# Patient Record
Sex: Female | Born: 1975 | Race: Black or African American | Hispanic: No | Marital: Single | Smoking: Never smoker
Health system: Southern US, Community
[De-identification: ages and names within clinical notes are randomized; demographics above are authoritative.]

## PROBLEM LIST (undated history)

## (undated) DIAGNOSIS — B009 Herpesviral infection, unspecified: Secondary | ICD-10-CM

## (undated) DIAGNOSIS — M549 Dorsalgia, unspecified: Secondary | ICD-10-CM

## (undated) DIAGNOSIS — K219 Gastro-esophageal reflux disease without esophagitis: Secondary | ICD-10-CM

## (undated) HISTORY — PX: ABDOMINAL HYSTERECTOMY: SHX81

---

## 2010-12-02 DIAGNOSIS — J309 Allergic rhinitis, unspecified: Secondary | ICD-10-CM | POA: Insufficient documentation

## 2010-12-02 DIAGNOSIS — K219 Gastro-esophageal reflux disease without esophagitis: Secondary | ICD-10-CM | POA: Insufficient documentation

## 2014-08-05 DIAGNOSIS — M722 Plantar fascial fibromatosis: Secondary | ICD-10-CM | POA: Insufficient documentation

## 2015-07-27 ENCOUNTER — Ambulatory Visit
Admission: EM | Admit: 2015-07-27 | Discharge: 2015-07-27 | Disposition: A | Discharge status: Home / Self Care | Payer: Managed Care, Other (non HMO) | Attending: Family Medicine | Admitting: Family Medicine

## 2015-07-27 ENCOUNTER — Encounter: Payer: Self-pay | Admitting: *Deleted

## 2015-07-27 DIAGNOSIS — J019 Acute sinusitis, unspecified: Secondary | ICD-10-CM

## 2015-07-27 DIAGNOSIS — J209 Acute bronchitis, unspecified: Secondary | ICD-10-CM

## 2015-07-27 HISTORY — DX: Gastro-esophageal reflux disease without esophagitis: K21.9

## 2015-07-27 HISTORY — DX: Herpesviral infection, unspecified: B00.9

## 2015-07-27 HISTORY — DX: Dorsalgia, unspecified: M54.9

## 2015-07-27 MED ORDER — AMOXICILLIN-POT CLAVULANATE 875-125 MG PO TABS: 1.0000 | ORAL_TABLET | Freq: Two times a day (BID) | ORAL | Status: DC

## 2015-07-27 MED ORDER — HYDROCOD POLST-CPM POLST ER 10-8 MG/5ML PO SUER: 5.0000 mL | Freq: Two times a day (BID) | ORAL | Status: DC | PRN

## 2015-07-27 MED ORDER — FEXOFENADINE-PSEUDOEPHED ER 180-240 MG PO TB24: 1.0000 | ORAL_TABLET | Freq: Every day | ORAL | Status: DC

## 2015-07-27 NOTE — ED Notes (Signed)
Patient started having symptoms of nasal congestion and cough 2 weeks ago. Patient went to the doctor this past Friday and was prescribed cough medicine. Patient symptoms became severe with nasal congestion, sever cough and sore throat.

## 2015-07-27 NOTE — ED Provider Notes (Signed)
CSN: [id number]     Arrival date & time 07/27/15  4098 History   First MD Initiated Contact with Patient 07/27/15 [phone]    Nurses notes were reviewed. Chief Complaint  Patient presents with  . Cough   Patient reports she's been sick now for the last 12 days. She reports back for as far as his symptoms to improve to a degree but then gets worse again. She reports congestion coughing and is feeling overall poorly. She reports that initially she had put. Remote febrile illness with cough and nasal congestion continued to come back. She was having some female problems middle of last week somewhat upsetting her GYN instead of her PCP and she also states it was easy to [facility]. Her GYN. We'll place on Tessalon Perles 100 mg 3 times a day which seemed to help the cough. She states she asked he felt she was getting better Saturday and Sunday he came back with a vengeance. Sunday night she reports having wheezing coughing and chest congestion. Stasis Sunday she started coughing up thick dark green sputum stent the light yellowish-green that she had been coughing up earlier. States she was unable to sleep last night and feels miserable. She reports sore throat which cancer with his discomfort from coughing and also postnasal drainage and stuffiness sinuses   (Consider location/radiation/quality/duration/timing/severity/associated sxs/prior Treatment) Patient is a 40 y.o. female presenting with cough. The history is provided by the patient. No language interpreter was used.  Cough Cough characteristics:  Productive Sputum characteristics:  Green Severity:  Moderate Duration:  12 days Timing:  Intermittent Progression:  Worsening Chronicity:  New Smoker: no   Context: sick contacts and upper respiratory infection   Relieved by:  Nothing Worsened by:  Activity and lying down Ineffective treatments:  Cough suppressants Associated symptoms: rhinorrhea, shortness of breath, sinus congestion and sore throat    Associated symptoms: no ear fullness and no ear pain     Past Medical History  Diagnosis Date  . GERD (gastroesophageal reflux disease)   . Back pain   . Herpes    History reviewed. No pertinent past surgical history. History reviewed. No pertinent family history. Social History  Substance Use Topics  . Smoking status: Never Smoker   . Smokeless tobacco: Never Used  . Alcohol Use: No   OB History    No data available     Review of Systems  HENT: Positive for rhinorrhea and sore throat. Negative for ear pain.   Respiratory: Positive for cough and shortness of breath.   All other systems reviewed and are negative.   Allergies  Review of patient's allergies indicates no known allergies.  Home Medications   Prior to Admission medications   Medication Sig Start Date End Date Taking? Authorizing Provider  benzonatate (TESSALON) 100 MG capsule Take 100 mg by mouth 3 (three) times daily.   Yes Historical Provider, MD  naproxen (NAPROSYN) 500 MG tablet Take 500 mg by mouth daily.   Yes Historical Provider, MD  norethindrone-ethinyl estradiol (OVCON-35,BALZIVA,BRIELLYN) 0.4-35 MG-MCG tablet Take 1 tablet by mouth daily.   Yes Historical Provider, MD  pantoprazole (PROTONIX) 40 MG tablet Take 40 mg by mouth daily.   Yes Historical Provider, MD  amoxicillin-clavulanate (AUGMENTIN) 875-125 MG tablet Take 1 tablet by mouth 2 (two) times daily. 07/27/15   Hassan Rowan, MD  chlorpheniramine-HYDROcodone [facility] PENNKINETIC ER) 10-8 MG/5ML SUER Take 5 mLs by mouth every 12 (twelve) hours as needed for cough. 07/27/15   Hassan Rowan, MD  fexofenadine-pseudoephedrine (ALLEGRA-D ALLERGY & CONGESTION) 180-240 MG 24 hr tablet Take 1 tablet by mouth daily. 07/27/15   Hassan Rowan, MD   Meds Ordered and Administered this Visit  Medications - No data to display  BP 153/89 mmHg  Pulse 118  Temp(Src) 98.8 F (37.1 C)  Resp 18  Ht  (1.651 m)  Wt 181 lb (82.101 kg)  BMI 30.12 kg/m2  SpO2  100%  LMP 06/23/2015 No data found.   Physical Exam  Constitutional: She is oriented to person, place, and time. She appears well-developed and well-nourished.  HENT:  Head: Normocephalic.  Eyes: Conjunctivae are normal. Pupils are equal, round, and reactive to light.  Neck: Normal range of motion. Neck supple. No thyromegaly present.  Cardiovascular: Normal rate, regular rhythm and normal heart sounds.   Pulmonary/Chest: Effort normal.  Musculoskeletal: Normal range of motion.  Lymphadenopathy:    She has cervical adenopathy.  Neurological: She is alert and oriented to person, place, and time.  Skin: Skin is warm and dry.  Psychiatric: She has a normal mood and affect.  Vitals reviewed.   ED Course  Procedures (including critical care time)  Labs Review Labs Reviewed - No data to display  Imaging Review No results found.   Visual Acuity Review  Right Eye Distance:   Left Eye Distance:   Bilateral Distance:    Right Eye Near:   Left Eye Near:    Bilateral Near:         MDM   1. Acute bronchitis, unspecified organism   2. Acute sinusitis, recurrence not specified, unspecified location    We'll place her on Augmentin 875 one tablet twice a day. Allegra-D 1 tablet daily. Stop Jerilynn Som will place on Tussionex 1 teaspoon twice a day return for follow-up in 2 weeks if needed and give her work note for today and tomorrow.  Note: This dictation was prepared with Dragon dictation along with smaller phrase technology. Any transcriptional errors that result from this process are unintentional.    Hassan Rowan, MD 07/27/15 [phone]

## 2015-07-27 NOTE — Discharge Instructions (Signed)
Sinusitis, Adult Sinusitis is redness, soreness, and puffiness (inflammation) of the air pockets in the bones of your face (sinuses). The redness, soreness, and puffiness can cause air and mucus to get trapped in your sinuses. This can allow germs to grow and cause an infection.  HOME CARE   Drink enough fluids to keep your pee (urine) clear or pale yellow.  Use a humidifier in your home.  Run a hot shower to create steam in the bathroom. Sit in the bathroom with the door closed. Breathe in the steam 3-4 times a day.  Put a warm, moist washcloth on your face 3-4 times a day, or as told by your doctor.  Use salt water sprays (saline sprays) to wet the thick fluid in your nose. This can help the sinuses drain.  Only take medicine as told by your doctor. GET HELP RIGHT AWAY IF:   Your pain gets worse.  You have very bad headaches.  You are sick to your stomach (nauseous).  You throw up (vomit).  You are very sleepy (drowsy) all the time.  Your face is puffy (swollen).  Your vision changes.  You have a stiff neck.  You have trouble breathing. MAKE SURE YOU:   Understand these instructions.  Will watch your condition.  Will get help right away if you are not doing well or get worse.   This information is not intended to replace advice given to you by your health care provider. Make sure you discuss any questions you have with your health care provider.   Document Released: 11/02/2007 Document Revised: 06/06/2014 Document Reviewed: 12/20/2011 Elsevier Interactive Patient Education 2016 Elsevier Inc[employer]  Acute Bronchitis Bronchitis is when the airways that extend from the windpipe into the lungs get red, puffy, and painful (inflamed). Bronchitis often causes thick spit (mucus) to develop. This leads to a cough. A cough is the most common symptom of bronchitis. In acute bronchitis, the condition usually begins suddenly and goes away over time (usually in 2 weeks). Smoking,  allergies, and asthma can make bronchitis worse. Repeated episodes of bronchitis may cause more lung problems. HOME CARE  Rest.  Drink enough fluids to keep your pee (urine) clear or pale yellow (unless you need to limit fluids as told by your doctor).  Only take over-the-counter or prescription medicines as told by your doctor.  Avoid smoking and secondhand smoke. These can make bronchitis worse. If you are a smoker, think about using nicotine gum or skin patches. Quitting smoking will help your lungs heal faster.  Reduce the chance of getting bronchitis again by:  Washing your hands often.  Avoiding people with cold symptoms.  Trying not to touch your hands to your mouth, nose, or eyes.  Follow up with your doctor as told. GET HELP IF: Your symptoms do not improve after 1 week of treatment. Symptoms include:  Cough.  Fever.  Coughing up thick spit.  Body aches.  Chest congestion.  Chills.  Shortness of breath.  Sore throat. GET HELP RIGHT AWAY IF:   You have an increased fever.  You have chills.  You have severe shortness of breath.  You have bloody thick spit (sputum).  You throw up (vomit) often.  You lose too much body fluid (dehydration).  You have a severe headache.  You faint. MAKE SURE YOU:   Understand these instructions.  Will watch your condition.  Will get help right away if you are not doing well or get worse.   This information is  not intended to replace advice given to you by your health care provider. Make sure you discuss any questions you have with your health care provider. °  °Document Released: 11/02/2007 Document Revised: 01/16/2013 Document Reviewed: 11/06/2012 °Elsevier Interactive Patient Education ©2016 Elsevier Inc. ° °

## 2015-11-28 HISTORY — PX: TUBAL LIGATION: SHX77

## 2015-12-25 DIAGNOSIS — N803 Endometriosis of pelvic peritoneum, unspecified: Secondary | ICD-10-CM | POA: Insufficient documentation

## 2016-02-04 ENCOUNTER — Ambulatory Visit
Admission: EM | Admit: 2016-02-04 | Discharge: 2016-02-04 | Disposition: A | Discharge status: Home / Self Care | Payer: Managed Care, Other (non HMO) | Attending: Family Medicine | Admitting: Family Medicine

## 2016-02-04 DIAGNOSIS — J209 Acute bronchitis, unspecified: Secondary | ICD-10-CM | POA: Diagnosis not present

## 2016-02-04 DIAGNOSIS — J019 Acute sinusitis, unspecified: Secondary | ICD-10-CM | POA: Diagnosis not present

## 2016-02-04 MED ORDER — AMOXICILLIN-POT CLAVULANATE 875-125 MG PO TABS: 1.0000 | ORAL_TABLET | Freq: Two times a day (BID) | ORAL | 0 refills | Status: DC

## 2016-02-04 MED ORDER — CETIRIZINE-PSEUDOEPHEDRINE ER 5-120 MG PO TB12: 1.0000 | ORAL_TABLET | Freq: Two times a day (BID) | ORAL | 0 refills | Status: DC | PRN

## 2016-02-04 MED ORDER — TERCONAZOLE 80 MG VA SUPP: 80.0000 mg | Freq: Every day | VAGINAL | 0 refills | Status: DC

## 2016-02-04 MED ORDER — HYDROCOD POLST-CPM POLST ER 10-8 MG/5ML PO SUER: 5.0000 mL | Freq: Two times a day (BID) | ORAL | 0 refills | Status: DC | PRN

## 2016-02-04 NOTE — ED Provider Notes (Signed)
[facility]    CSN: [id number] Arrival date & time: 02/04/16  1191  First Provider Contact:  First MD Initiated Contact with Patient 02/04/16 [phone]        History   Chief Complaint Chief Complaint  Patient presents with  . Cough    HPI Judith Ellis is a 40 y.o. female.   40 year old black female with a history of runningcongestion that started about a week ago last Wednesday. She reports the nasal congestion is gotten worse in the coughing has gotten worse. She has some test next seemed to help cough for a while but she ran out of that. She is taking just plain Zyrtec as well. States that the phlegm that she's gone from nose and coughing up was clear but now states it has become yellow green and thick. Also has been going on now for 9 days. She states she's had a sore throat and coughing and the postnasal drainage. No known drug allergies. Only previous surgery is tubal ligation this diabetes and one of her parents and she does not smoke.      No language interpreter was used.  Cough  Cough characteristics:  Productive Severity:  Moderate Onset quality:  Sudden Timing:  Constant Progression:  Worsening Chronicity:  New Smoker: no   Context: exposure to allergens and upper respiratory infection   Context: not smoke exposure   Relieved by:  Cough suppressants Worsened by:  Nothing Ineffective treatments:  None tried Associated symptoms: rash, rhinorrhea, sinus congestion and sore throat   Associated symptoms: no chest pain, no chills, no diaphoresis, no ear fullness, no ear pain, no fever, no headaches, no myalgias and no wheezing     Past Medical History:  Diagnosis Date  . Back pain   . GERD (gastroesophageal reflux disease)   . Herpes     There are no active problems to display for this patient.   Past Surgical History:  Procedure Laterality Date  . TUBAL LIGATION  11/2015    OB History    No data available       Home Medications    Prior  to Admission medications   Medication Sig Start Date End Date Taking? Authorizing Provider  cetirizine (ZYRTEC) 10 MG tablet Take 10 mg by mouth daily.   Yes Historical Provider, MD  pantoprazole (PROTONIX) 40 MG tablet Take 40 mg by mouth daily.   Yes Historical Provider, MD  amoxicillin-clavulanate (AUGMENTIN) 875-125 MG tablet Take 1 tablet by mouth 2 (two) times daily. 07/27/15   Hassan Rowan, MD  amoxicillin-clavulanate (AUGMENTIN) 875-125 MG tablet Take 1 tablet by mouth 2 (two) times daily. 02/04/16   Hassan Rowan, MD  benzonatate (TESSALON) 100 MG capsule Take 100 mg by mouth 3 (three) times daily.    Historical Provider, MD  cetirizine-pseudoephedrine (ZYRTEC-D) 5-120 MG tablet Take 1 tablet by mouth 2 (two) times daily as needed for allergies. 02/04/16   Hassan Rowan, MD  chlorpheniramine-HYDROcodone [facility] PENNKINETIC ER) 10-8 MG/5ML SUER Take 5 mLs by mouth every 12 (twelve) hours as needed for cough. 07/27/15   Hassan Rowan, MD  chlorpheniramine-HYDROcodone [facility] PENNKINETIC ER) 10-8 MG/5ML SUER Take 5 mLs by mouth every 12 (twelve) hours as needed for cough. 02/04/16   Hassan Rowan, MD  fexofenadine-pseudoephedrine (ALLEGRA-D ALLERGY & CONGESTION) 180-240 MG 24 hr tablet Take 1 tablet by mouth daily. 07/27/15   Hassan Rowan, MD  naproxen (NAPROSYN) 500 MG tablet Take 500 mg by mouth daily.    Historical Provider, MD  norethindrone-ethinyl estradiol (OVCON-35,BALZIVA,BRIELLYN) 0.4-35 MG-MCG tablet Take 1 tablet by mouth daily.    Historical Provider, MD  terconazole (TERAZOL 3) 80 MG vaginal suppository Place 1 suppository (80 mg total) vaginally at bedtime. 02/04/16   Hassan RowanEugene Buffey Zabinski, MD    Family History History reviewed. No pertinent family history.  Social History Social History  Substance Use Topics  . Smoking status: Never Smoker  . Smokeless tobacco: Never Used  . Alcohol use No     Allergies   Review of patient's allergies indicates no known allergies.   Review of  Systems Review of Systems  Constitutional: Negative for chills, diaphoresis and fever.  HENT: Positive for rhinorrhea and sore throat. Negative for ear pain.   Respiratory: Positive for cough. Negative for wheezing.   Cardiovascular: Negative for chest pain.  Musculoskeletal: Negative for myalgias.  Skin: Positive for rash.  Neurological: Negative for headaches.  All other systems reviewed and are negative.    Physical Exam Triage Vital Signs ED Triage Vitals  Enc Vitals Group     BP 02/04/16 0920 105/74     Pulse Rate 02/04/16 0920 80     Resp 02/04/16 0920 16     Temp 02/04/16 0920 98.6 F (37 C)     Temp Source 02/04/16 0920 Tympanic     SpO2 02/04/16 0920 100 %     Weight 02/04/16 0919 182 lb (82.6 kg)     Height 02/04/16 0919 5\' 5"  (1.651 m)     Head Circumference --      Peak Flow --      Pain Score 02/04/16 0922 0     Pain Loc --      Pain Edu? --      Excl. in GC? --    No data found.   Updated Vital Signs BP 105/74 (BP Location: Left Arm)   Pulse 80   Temp 98.6 F (37 C) (Tympanic)   Resp 16   Ht 5\' 5"  (1.651 m)   Wt 182 lb (82.6 kg)   LMP 01/28/2016   SpO2 100%   BMI 30.29 kg/m   Visual Acuity Right Eye Distance:   Left Eye Distance:   Bilateral Distance:    Right Eye Near:   Left Eye Near:    Bilateral Near:     Physical Exam  Constitutional: She is oriented to person, place, and time. She appears well-developed and well-nourished.  HENT:  Head: Normocephalic and atraumatic.  Right Ear: Hearing, tympanic membrane, external ear and ear canal normal.  Left Ear: Hearing, tympanic membrane and external ear normal.  Nose: Mucosal edema and rhinorrhea present. Right sinus exhibits maxillary sinus tenderness. Left sinus exhibits maxillary sinus tenderness.  Mouth/Throat: Uvula is midline, oropharynx is clear and moist and mucous membranes are normal. No oropharyngeal exudate or posterior oropharyngeal edema.  Eyes: Pupils are equal, round, and  reactive to light.  Neck: Normal range of motion. Neck supple.  Cardiovascular: Normal rate, regular rhythm and normal heart sounds.   Pulmonary/Chest: Effort normal and breath sounds normal.  Musculoskeletal: Normal range of motion.  Lymphadenopathy:    She has cervical adenopathy.  Neurological: She is alert and oriented to person, place, and time.  Skin: Skin is warm.  Psychiatric: She has a normal mood and affect.  Vitals reviewed.    UC Treatments / Results  Labs (all labs ordered are listed, but only abnormal results are displayed) Labs Reviewed - No data to display  EKG  EKG Interpretation None  Radiology No results found.  Procedures Procedures (including critical care time)  Medications Ordered in UC Medications - No data to display   Initial Impression / Assessment and Plan / UC Course  I have reviewed the triage vital signs and the nursing notes.  Pertinent labs & imaging results that were available during my care of the patient were reviewed by me and considered in my medical decision making (see chart for details).  Clinical Course    We'll place patient on Tussionex 1 teaspoon twice a day she's used this before and he seemed to help. Patient Zyrtec-D 1 tablet twice a day and Augmentin twice daily as well. She does get yeast infection but states Diflucan doesn't help so she will be given a prescription for Terazol vaginal suppositories to use nightly for 3 nights. Work note given for today and tomorrow as needed.  Final Clinical Impressions(s) / UC Diagnoses   Final diagnoses:  Acute bronchitis, unspecified organism  Acute sinusitis, unspecified    New Prescriptions Discharge Medication List as of 02/04/2016  9:49 AM    START taking these medications   Details  !! amoxicillin-clavulanate (AUGMENTIN) 875-125 MG tablet Take 1 tablet by mouth 2 (two) times daily., Starting Thu 02/04/2016, Normal    cetirizine-pseudoephedrine (ZYRTEC-D) 5-120 MG  tablet Take 1 tablet by mouth 2 (two) times daily as needed for allergies., Starting Thu 02/04/2016, Normal    !! chlorpheniramine-HYDROcodone (TUSSIONEX PENNKINETIC ER) 10-8 MG/5ML SUER Take 5 mLs by mouth every 12 (twelve) hours as needed for cough., Starting Thu 02/04/2016, Normal    terconazole (TERAZOL 3) 80 MG vaginal suppository Place 1 suppository (80 mg total) vaginally at bedtime., Starting Thu 02/04/2016, Normal     !! - Potential duplicate medications found. Please discuss with provider.       Hassan Rowan, MD 02/04/16 1007

## 2016-02-04 NOTE — ED Triage Notes (Signed)
Patient complains of cough, sneezing, sinus pain and pressure that started one week ago. Patient states that she has been using zyrtec with little relief.

## 2016-07-08 DIAGNOSIS — D573 Sickle-cell trait: Secondary | ICD-10-CM | POA: Insufficient documentation

## 2016-07-15 DIAGNOSIS — E559 Vitamin D deficiency, unspecified: Secondary | ICD-10-CM | POA: Insufficient documentation

## 2017-02-05 ENCOUNTER — Ambulatory Visit
Admission: EM | Admit: 2017-02-05 | Discharge: 2017-02-05 | Disposition: A | Discharge status: Home / Self Care | Payer: Managed Care, Other (non HMO) | Attending: Family Medicine | Admitting: Family Medicine

## 2017-02-05 ENCOUNTER — Encounter: Payer: Self-pay | Admitting: Gynecology

## 2017-02-05 DIAGNOSIS — J208 Acute bronchitis due to other specified organisms: Secondary | ICD-10-CM | POA: Diagnosis not present

## 2017-02-05 DIAGNOSIS — J0120 Acute ethmoidal sinusitis, unspecified: Secondary | ICD-10-CM

## 2017-02-05 MED ORDER — FLUCONAZOLE 150 MG PO TABS: ORAL_TABLET | ORAL | 0 refills | Status: AC

## 2017-02-05 MED ORDER — AMOXICILLIN-POT CLAVULANATE 875-125 MG PO TABS: 1.0000 | ORAL_TABLET | Freq: Two times a day (BID) | ORAL | 0 refills | Status: AC

## 2017-02-05 NOTE — ED Triage Notes (Signed)
Patient c/o deep cough over 1 week.

## 2017-02-05 NOTE — ED Provider Notes (Signed)
[facility]    CSN: [id number] Arrival date & time: 02/05/17  0908     History   Chief Complaint Chief Complaint  Patient presents with  . Cough    HPI Judith Ellis is a 41 y.o. female.   41 year old female presents with nasal congestion and cough for over 3 weeks. Nasal congestion has improved but continues to have cough with yellow to green mucus and headaches. Denies any fever or GI symptoms. Has taken Tessalon cough pills with no relief and Mucinex with some relief. Co-worker is recently ill with similar symptoms. No other chronic health issues currently except GERD and takes Protonix daily.    The history is provided by the patient.    Past Medical History:  Diagnosis Date  . Back pain   . GERD (gastroesophageal reflux disease)   . Herpes     There are no active problems to display for this patient.   Past Surgical History:  Procedure Laterality Date  . ABDOMINAL HYSTERECTOMY    . TUBAL LIGATION  11/2015    OB History    No data available       Home Medications    Prior to Admission medications   Medication Sig Start Date End Date Taking? Authorizing Provider  pantoprazole (PROTONIX) 40 MG tablet Take 40 mg by mouth daily.   Yes [provider]  amoxicillin-clavulanate (AUGMENTIN) 875-125 MG tablet Take 1 tablet by mouth every 12 (twelve) hours. 02/05/17 02/12/17  Sudie Grumbling, NP  fluconazole (DIFLUCAN) 150 MG tablet Take 1 tablet by mouth tomorrow, repeat 1 tablet again in 5 days to prevent antibiotic induced yeast vaginitis 02/05/17   Sudie Grumbling, NP    Family History No family history on file.  Social History Social History  Substance Use Topics  . Smoking status: Never Smoker  . Smokeless tobacco: Never Used  . Alcohol use No     Allergies   Patient has no known allergies.   Review of Systems Review of Systems  Constitutional: Positive for fatigue. Negative for activity change, appetite change, chills and  fever.  HENT: Positive for congestion, postnasal drip, sinus pressure and sore throat. Negative for ear discharge, ear pain, mouth sores, nosebleeds, rhinorrhea, sinus pain, sneezing and trouble swallowing.   Eyes: Negative for discharge, redness and itching.  Respiratory: Positive for cough and chest tightness. Negative for shortness of breath and wheezing.   Cardiovascular: Negative for chest pain, palpitations and leg swelling.  Gastrointestinal: Negative for abdominal pain, diarrhea, nausea and vomiting.  Genitourinary: Negative for difficulty urinating and dysuria.  Musculoskeletal: Negative for arthralgias, back pain, myalgias, neck pain and neck stiffness.  Skin: Negative for rash and wound.  Allergic/Immunologic: Negative for immunocompromised state.  Neurological: Positive for headaches. Negative for dizziness, seizures, syncope, weakness, light-headedness and numbness.  Hematological: Negative for adenopathy. Does not bruise/bleed easily.     Physical Exam Triage Vital Signs ED Triage Vitals  Enc Vitals Group     BP 02/05/17 0920 107/73     Pulse Rate 02/05/17 0920 70     Resp --      Temp 02/05/17 0920 98.4 F (36.9 C)     Temp Source 02/05/17 0920 Oral     SpO2 02/05/17 0920 100 %     Weight 02/05/17 0921 182 lb (82.6 kg)     Height 02/05/17 0921  (1.651 m)     Head Circumference --      Peak Flow --  Pain Score 02/05/17 0921 3     Pain Loc --      Pain Edu? --      Excl. in GC? --    No data found.   Updated Vital Signs BP 107/73 (BP Location: Left Arm)   Pulse 70   Temp 98.4 F (36.9 C) (Oral)   Ht  (1.651 m)   Wt 182 lb (82.6 kg)   LMP 01/28/2016   SpO2 100%   BMI 30.29 kg/m   Visual Acuity Right Eye Distance:   Left Eye Distance:   Bilateral Distance:    Right Eye Near:   Left Eye Near:    Bilateral Near:     Physical Exam  Constitutional: She is oriented to person, place, and time. She appears well-developed and  well-nourished. She appears ill. No distress.  HENT:  Head: Normocephalic and atraumatic.  Right Ear: Hearing, tympanic membrane, external ear and ear canal normal.  Left Ear: Hearing, tympanic membrane, external ear and ear canal normal.  Nose: Mucosal edema and rhinorrhea present. Right sinus exhibits no maxillary sinus tenderness and no frontal sinus tenderness. Left sinus exhibits no maxillary sinus tenderness and no frontal sinus tenderness.  Mouth/Throat: Uvula is midline and mucous membranes are normal. Posterior oropharyngeal erythema (with yellow post nasal drainage) present.  Eyes: Conjunctivae and EOM are normal.  Neck: Normal range of motion. Neck supple.  Cardiovascular: Normal rate, regular rhythm and normal heart sounds.   No murmur heard. Pulmonary/Chest: Effort normal. No respiratory distress. She has decreased breath sounds in the right middle field, the right lower field and the left lower field. She has no wheezes. She has no rhonchi. She has no rales.  Musculoskeletal: Normal range of motion.  Lymphadenopathy:       Head (right side): Tonsillar adenopathy present.       Head (left side): Tonsillar adenopathy present.    She has cervical adenopathy.       Right cervical: Superficial cervical adenopathy present.       Left cervical: Superficial cervical adenopathy present.  Neurological: She is alert and oriented to person, place, and time.  Skin: Skin is warm and dry. Capillary refill takes less than 2 seconds.  Psychiatric: She has a normal mood and affect. Her behavior is normal. Judgment and thought content normal.     UC Treatments / Results  Labs (all labs ordered are listed, but only abnormal results are displayed) Labs Reviewed - No data to display  EKG  EKG Interpretation None       Radiology No results found.  Procedures Procedures (including critical care time)  Medications Ordered in UC Medications - No data to display   Initial Impression  / Assessment and Plan / UC Course  I have reviewed the triage vital signs and the nursing notes.  Pertinent labs & imaging results that were available during my care of the patient were reviewed by me and considered in my medical decision making (see chart for details).    Discussed with patient that she has mild sinusitis and bronchitis. Recommend start  Augmentin  twice a day as directed (not listed properly in Epic in discharge medication list but under new Rx). Continue taking Mucinex as directed. Take Diflucan  one tablet tomorrow and repeat 1 dose in 5 days to prevent antibiotic-induced yeast vaginitis. Increase fluid intake to help loosen up mucus. Follow-up in 5 days with her PCP if not improving.    Final Clinical Impressions(s) / UC  Diagnoses   Final diagnoses:  Acute bronchitis due to other specified organisms  Acute ethmoidal sinusitis, recurrence not specified    New Prescriptions Discharge Medication List as of 02/05/2017  9:51 AM    START taking these medications   Details  fluconazole (DIFLUCAN) 150 MG tablet Take 1 tablet by mouth tomorrow, repeat 1 tablet again in 5 days to prevent antibiotic induced yeast vaginitis, Normal         Controlled Substance Prescriptions [place] Controlled Substance Registry consulted? Not Applicable   Sudie Grumblingmyot, Ann Berry, NP 02/05/17 1203

## 2017-02-05 NOTE — Discharge Instructions (Addendum)
Recommend start Augmentin  twice a day as directed. Continue taking Mucinex as directed. Increase fluid intake to help loosen up mucus. Follow-up in 5 days with your PCP if not improving.

## 2017-06-28 DIAGNOSIS — D0511 Intraductal carcinoma in situ of right breast: Secondary | ICD-10-CM | POA: Insufficient documentation

## 2017-07-12 DIAGNOSIS — E669 Obesity, unspecified: Secondary | ICD-10-CM | POA: Insufficient documentation

## 2017-10-11 DIAGNOSIS — Z9889 Other specified postprocedural states: Secondary | ICD-10-CM | POA: Insufficient documentation

## 2020-04-29 DIAGNOSIS — Z90710 Acquired absence of both cervix and uterus: Secondary | ICD-10-CM | POA: Insufficient documentation

## 2021-02-08 ENCOUNTER — Ambulatory Visit
Admission: EM | Admit: 2021-02-08 | Discharge: 2021-02-08 | Disposition: A | Discharge status: Home / Self Care | Payer: Managed Care, Other (non HMO) | Attending: Physician Assistant | Admitting: Physician Assistant

## 2021-02-08 ENCOUNTER — Other Ambulatory Visit: Payer: Self-pay

## 2021-02-08 ENCOUNTER — Encounter: Payer: Self-pay | Admitting: Emergency Medicine

## 2021-02-08 ENCOUNTER — Ambulatory Visit (INDEPENDENT_AMBULATORY_CARE_PROVIDER_SITE_OTHER): Payer: Managed Care, Other (non HMO)

## 2021-02-08 DIAGNOSIS — S92515A Nondisplaced fracture of proximal phalanx of left lesser toe(s), initial encounter for closed fracture: Secondary | ICD-10-CM | POA: Diagnosis not present

## 2021-02-08 DIAGNOSIS — M79675 Pain in left toe(s): Secondary | ICD-10-CM | POA: Diagnosis not present

## 2021-02-08 NOTE — ED Triage Notes (Signed)
Pt presents today with c/o pain/swelling to left 5th (pinky) toe x 1 week. She reports striking it on bedroom wall.

## 2021-02-08 NOTE — ED Provider Notes (Signed)
[facility]    CSN: [id number] Arrival date & time: 02/08/21  0800      History   Chief Complaint Chief Complaint  Patient presents with   Toe Pain   Toe Injury    Left 5th toe    HPI Judith Ellis is a 45 y.o. female presenting for 1 week history or pain, swelling, bruising to the left fifth toe. She says that she struck it on her bathroom wall a week ago and then yesterday she says she excellently ran into her husband's foot and it began to hurt again.  No numbness, tingling, weakness. No history of prior fracture.  Patient has not applied ice to the toe.  She did take ibuprofen yesterday which helped.  Patient is a Biochemist, clinical.  She has no other complaints.  HPI  Past Medical History:  Diagnosis Date   Back pain    GERD (gastroesophageal reflux disease)    Herpes     There are no problems to display for this patient.   Past Surgical History:  Procedure Laterality Date   ABDOMINAL HYSTERECTOMY     TUBAL LIGATION  11/2015    OB History   No obstetric history on file.      Home Medications    Prior to Admission medications   Medication Sig Start Date End Date Taking? Authorizing Provider  fluconazole (DIFLUCAN) 150 MG tablet Take 1 tablet by mouth tomorrow, repeat 1 tablet again in 5 days to prevent antibiotic induced yeast vaginitis 02/05/17   Sudie Grumbling, NP  pantoprazole (PROTONIX) 40 MG tablet Take 40 mg by mouth daily.    [provider]    Family History History reviewed. No pertinent family history.  Social History Social History   Tobacco Use   Smoking status: Never   Smokeless tobacco: Never  Substance Use Topics   Alcohol use: No   Drug use: No     Allergies   Patient has no known allergies.   Review of Systems Review of Systems  Musculoskeletal:  Positive for arthralgias and joint swelling. Negative for gait problem.  Skin:  Positive for color change. Negative for wound.  Neurological:  Negative for  weakness and numbness.    Physical Exam Triage Vital Signs ED Triage Vitals  Enc Vitals Group     BP 02/08/21 0826 119/88     Pulse Rate 02/08/21 0826 76     Resp 02/08/21 0826 18     Temp 02/08/21 0826 98.2 F (36.8 C)     Temp Source 02/08/21 0826 Oral     SpO2 02/08/21 0826 100 %     Weight --      Height --      Head Circumference --      Peak Flow --      Pain Score 02/08/21 0823 4     Pain Loc --      Pain Edu? --      Excl. in GC? --    No data found.  Updated Vital Signs BP 119/88 (BP Location: Right Arm)   Pulse 76   Temp 98.2 F (36.8 C) (Oral)   Resp 18   LMP 01/28/2016   SpO2 100%       Physical Exam Vitals and nursing note reviewed.  Constitutional:      General: She is not in acute distress.    Appearance: Normal appearance. She is not ill-appearing or toxic-appearing.  HENT:     Head: Normocephalic  and atraumatic.  Eyes:     General: No scleral icterus.       Right eye: No discharge.        Left eye: No discharge.     Conjunctiva/sclera: Conjunctivae normal.  Cardiovascular:     Rate and Rhythm: Normal rate and regular rhythm.     Pulses: Normal pulses.  Pulmonary:     Effort: Pulmonary effort is normal. No respiratory distress.  Musculoskeletal:     Cervical back: Neck supple.     Left foot: Normal range of motion and normal capillary refill. Swelling (mild swelling left 5th toe) and bony tenderness (TTP proximal phalanx) present. Normal pulse.  Skin:    General: Skin is dry.  Neurological:     General: No focal deficit present.     Mental Status: She is alert. Mental status is at baseline.     Motor: No weakness.     Gait: Gait normal.  Psychiatric:        Mood and Affect: Mood normal.        Behavior: Behavior normal.        Thought Content: Thought content normal.     UC Treatments / Results  Labs (all labs ordered are listed, but only abnormal results are displayed) Labs Reviewed - No data to  display  EKG   Radiology DG Toe 5th Left  Result Date: 02/08/2021 CLINICAL DATA:  Pain and swelling to fifth toe. EXAM: DG TOE 5TH LEFT COMPARISON:  None. FINDINGS: Oblique comminuted fracture noted in the diaphysis of the proximal phalanx without extension to an articular surface. Mild apex plantar angulation. IMPRESSION: Comminuted oblique fracture in the diaphysis of the proximal phalanx. Electronically Signed   By: Kennith Center M.D.   On: 02/08/2021 08:58    Procedures Procedures (including critical care time)  Medications Ordered in UC Medications - No data to display  Initial Impression / Assessment and Plan / UC Course  I have reviewed the triage vital signs and the nursing notes.  Pertinent labs & imaging results that were available during my care of the patient were reviewed by me and considered in my medical decision making (see chart for details).  44 old female presenting for left fifth toe pain for the past week.  X-ray obtained today shows comminuted oblique fracture of the proximal phalanx.  Reviewed result patient.  Advised her to was fractured.  Patient had toe buddy taped to fourth digit and placed in postop shoe.  Reviewed RICE guidelines and supportive care.  Contact information provided for orthopedics.  Work note given for seated work for the next 10 days.  Follow-up with [place] as needed.  Final Clinical Impressions(s) / UC Diagnoses   Final diagnoses:  Closed nondisplaced fracture of proximal phalanx of lesser toe of left foot, initial encounter     Discharge Instructions      Your toe is fractured.  It is fractured all the way through.  I would advise staying off of the toe for the next couple of weeks, at least 10 days and then you should be able to walk on it again.  Ice the toe every couple of hours and take ibuprofen and Tylenol as needed for pain relief.  Make an appointment with your primary care provider in the next couple weeks for follow-up.  Otherwise,  you can follow-up with [facility].  In such cases, as we discussed orthopedic surgeons may place a pin in the bone.  I have included contact information  for orthopedics below.  You have a condition requiring you to follow up with Orthopedics so please call one of the following office for appointment:   [facility] [address] [place], [place] [address] Phone: [phone]  [facility] [address] [place], [place] [address] Phone: [phone]      ED Prescriptions   None    PDMP not reviewed this encounter.   Shirlee Latch, PA-C 02/08/21 [phone]

## 2021-02-08 NOTE — Discharge Instructions (Signed)
Your toe is fractured.  It is fractured all the way through.  I would advise staying off of the toe for the next couple of weeks, at least 10 days and then you should be able to walk on it again.  Ice the toe every couple of hours and take ibuprofen and Tylenol as needed for pain relief.  Make an appointment with your primary care provider in the next couple weeks for follow-up.  Otherwise, you can follow-up with [facility].  In such cases, as we discussed orthopedic surgeons may place a pin in the bone.  I have included contact information for orthopedics below.  You have a condition requiring you to follow up with Orthopedics so please call one of the following office for appointment:   [facility] [address] [place], [place] [address] Phone: [phone]  [facility] [address] [place], [place] [address] Phone: [phone]

## 2022-09-30 IMAGING — CR DG TOE 5TH 2+V*L*
3 series · 3 of 3 positions shown · non-contrast
Comparison: None.

CLINICAL DATA: Pain and swelling to fifth toe.

EXAM:
DG TOE 5TH LEFT

[toe ap]
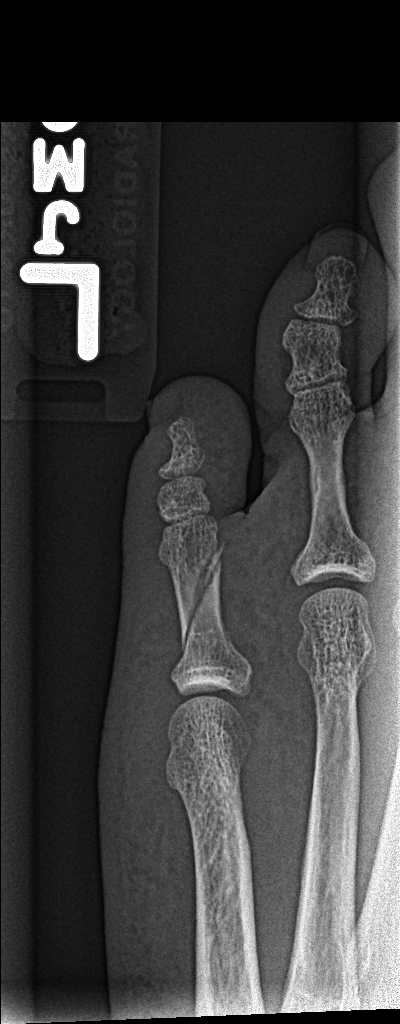

[toe obl]
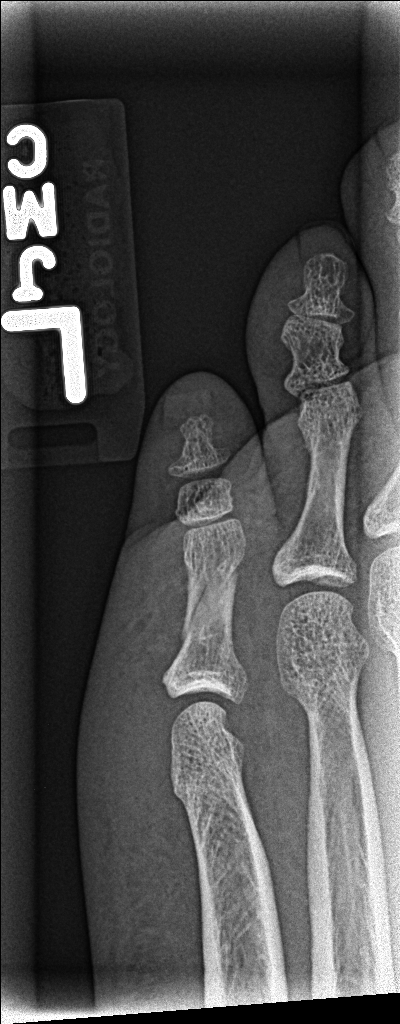

[toe lat]
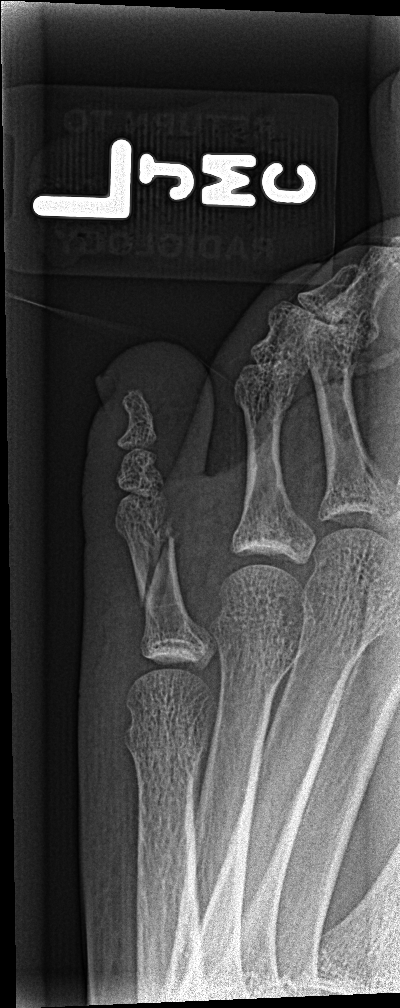

[3 of 3 positions shown; findings below may reference images not displayed]

FINDINGS: Oblique comminuted fracture noted in the diaphysis of the proximal
phalanx without extension to an articular surface. Mild apex plantar
angulation.
IMPRESSION: Comminuted oblique fracture in the diaphysis of the proximal
phalanx.

## 2022-11-03 DIAGNOSIS — M549 Dorsalgia, unspecified: Secondary | ICD-10-CM | POA: Insufficient documentation

## 2022-12-18 ENCOUNTER — Ambulatory Visit
Admission: EM | Admit: 2022-12-18 | Discharge: 2022-12-18 | Disposition: A | Discharge status: Home / Self Care | Payer: Managed Care, Other (non HMO) | Attending: Physician Assistant | Admitting: Physician Assistant

## 2022-12-18 ENCOUNTER — Ambulatory Visit (INDEPENDENT_AMBULATORY_CARE_PROVIDER_SITE_OTHER): Payer: Managed Care, Other (non HMO)

## 2022-12-18 DIAGNOSIS — R0782 Intercostal pain: Secondary | ICD-10-CM | POA: Diagnosis not present

## 2022-12-18 DIAGNOSIS — R0781 Pleurodynia: Secondary | ICD-10-CM | POA: Diagnosis not present

## 2022-12-18 DIAGNOSIS — S2242XA Multiple fractures of ribs, left side, initial encounter for closed fracture: Secondary | ICD-10-CM

## 2022-12-18 MED ORDER — HYDROCODONE-ACETAMINOPHEN 5-325 MG PO TABS: 1.0000 | ORAL_TABLET | Freq: Four times a day (QID) | ORAL | 0 refills | Status: AC | PRN

## 2022-12-18 NOTE — ED Triage Notes (Signed)
Patient presents to UC for rib injury x 3 weeks. She fell and landed face down. States left worse, right is better. Treating pain with tylenol and naproxen.

## 2022-12-18 NOTE — Discharge Instructions (Addendum)
-  You have at least 1, possibly 3 rib fractures.  As discussed this can take several weeks to heal.  Take it easy especially when changing positions.  Ice the area. - To continue naproxen and Tylenol during the day.  Try the pain medication at night to help you sleep if needed. - If still having symptoms after a couple more weeks or they worsen please follow-up with your PCP.

## 2022-12-18 NOTE — ED Provider Notes (Signed)
[facility]    CSN: [id number] Arrival date & time: 12/18/22  0801      History   Chief Complaint Chief Complaint  Patient presents with   Rib Injury    HPI Judith Ellis is a 47 y.o. female presenting for bilateral rib pain which is worse on the left side.  Patient reports an axonal fall 3 weeks ago.  States she fell forward onto her chest and head on cement.  She had bilateral rib pain but the pain on the right side improved and is mostly resolved.  Continues to have discomfort on left side but denies it being any worse over time.  She says she just thought it should be better by now.  She reports increased pain when rolling over, twisting, position changes and sometimes her bra hurts.  Denies any pain on breathing.  Denies fever, cough, congestion or shortness of breath.  Has been taking Tylenol and naproxen and says that takes the pain away but then it returns.  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      Home Medications    Prior to Admission medications   Medication Sig Start Date End Date Taking? Authorizing Provider  HYDROcodone-acetaminophen (NORCO) 5-325 MG tablet Take 1 tablet by mouth every 6 (six)  hours as needed for up to 3 days for severe pain. 12/18/22 12/21/22 Yes Shirlee Latch, PA-C  fluconazole (DIFLUCAN) 150 MG tablet Take 1 tablet by mouth tomorrow, repeat 1 tablet again in 5 days to prevent antibiotic induced yeast vaginitis 02/05/17   Sudie Grumbling, NP  pantoprazole (PROTONIX) 40 MG tablet Take 40 mg by mouth daily.    [provider]    Family History History reviewed. No pertinent family history.  Social History Social History   Tobacco Use   Smoking status: Never   Smokeless tobacco: Never  Substance Use Topics   Alcohol use: No   Drug use: No     Allergies   Patient has no known allergies.   Review of Systems Review of Systems  Constitutional:  Negative for fatigue and fever.  HENT:  Negative for congestion.   Respiratory:  Negative for cough, chest tightness, shortness of breath and wheezing.   Musculoskeletal:  Positive for arthralgias (rib pain). Negative for back pain.  Skin:  Negative for color change and wound.  Neurological:  Negative for weakness.     Physical Exam Triage Vital Signs ED Triage Vitals [12/18/22 0816]  Encounter Vitals Group     BP 109/73     Systolic BP Percentile      Diastolic BP Percentile      Pulse  Rate 78     Resp 16     Temp      Temp Source Oral     SpO2 98 %     Weight      Height      Head Circumference      Peak Flow      Pain Score 5     Pain Loc      Pain Education      Exclude from Growth Chart    No data found.  Updated Vital Signs BP 109/73 (BP Location: Left Arm)   Pulse 78   Resp 16   LMP 01/28/2016   SpO2 98%       Physical Exam Vitals and nursing note reviewed.  Constitutional:      General: She is not in acute distress.    Appearance: Normal appearance. She is not ill-appearing or toxic-appearing.  HENT:     Head: Normocephalic and atraumatic.  Eyes:     General: No scleral icterus.       Right eye: No discharge.        Left eye: No discharge.      Conjunctiva/sclera: Conjunctivae normal.  Cardiovascular:     Rate and Rhythm: Normal rate and regular rhythm.     Heart sounds: Normal heart sounds.  Pulmonary:     Effort: Pulmonary effort is normal. No respiratory distress.     Breath sounds: Normal breath sounds. No wheezing, rhonchi or rales.  Chest:     Chest wall: Tenderness (TTP left anterolateral ribs 6-9) present.  Musculoskeletal:     Cervical back: Neck supple.  Skin:    General: Skin is dry.  Neurological:     General: No focal deficit present.     Mental Status: She is alert. Mental status is at baseline.     Motor: No weakness.     Gait: Gait normal.  Psychiatric:        Mood and Affect: Mood normal.        Behavior: Behavior normal.        Thought Content: Thought content normal.      UC Treatments / Results  Labs (all labs ordered are listed, but only abnormal results are displayed) Labs Reviewed - No data to display  EKG   Radiology DG Ribs Unilateral W/Chest Left  Result Date: 12/18/2022 CLINICAL DATA:  Patient fell 3 weeks ago with continued rib pain bilaterally, left greater than right. EXAM: LEFT RIBS AND CHEST - 3+ VIEW COMPARISON:  None Available. FINDINGS: The lungs are clear without focal pneumonia, edema, pneumothorax or pleural effusion. The cardiopericardial silhouette is within normal limits for size. Surgical clips overlie the chest bilaterally. Radio-opaque marker has been placed on the skin at the site of patient concern. Oblique views of the left ribs suggest a nondisplaced fracture of the anterior most aspect of the left ninth rib. 1 image raises the possibility of similar acute nondisplaced fractures involving the anterior left seventh and eighth ribs. However, there is no evidence of bony callus at any of the sites as would be expected 3 weeks after an injury. IMPRESSION: 1. No acute cardiopulmonary findings. 2. Findings raise the question of nondisplaced fracture of the anterior most aspect  of the left ninth rib with possible nondisplaced fractures of the anterior left seventh and eighth ribs. No evidence for bony callus at any of the sites as would be expected 3 weeks after an injury. Electronically Signed   By: Minerva Areola  Molli Posey M.D.   On: 12/18/2022 08:53    Procedures Procedures (including critical care time)  Medications Ordered in UC Medications - No data to display  Initial Impression / Assessment and Plan / UC Course  I have reviewed the triage vital signs and the nursing notes.  Pertinent labs & imaging results that were available during my care of the patient were reviewed by me and considered in my medical decision making (see chart for details).   47 year old female presents for left-sided rib pain following a fall 3 weeks ago.  Patient reports falling and hitting her chest on cement.  Has been taking naproxen and Tylenol which helps pain.  No shortness of breath or pleuritic pain.  No fever, cough.  Vitals are stable and normal.  She is overall well-appearing.  Has tenderness of anterolateral ribs 6 through 8 on the left.  Chest clear to auscultation and for listening fields.  Left rib and chest x-ray obtained.  Suspected rib fractures of ribs 7, 8 and 9.  Reviewed results of x-ray with patient.  Supportive care advised rib fractures.  Patient works as a Biochemist, clinical.  Reports she has a mostly seated job but sometimes she is called the action when there is an emergency and she has this happen and have contact with inmates and run.  She would like restrictions for this.  Restrictions given for the next 2 weeks.  Supportive care advised continuing to follow RICE guidelines and naproxen.  Sent Norco as needed for severe pain for that she may sleep at night.  Reviewed if no improvement in the next couple weeks or symptoms worsen to follow-up with PCP.  ED precautions discussed.   Final Clinical Impressions(s) / UC Diagnoses   Final diagnoses:  Closed fracture of  multiple ribs of left side, initial encounter  Rib pain on left side     Discharge Instructions      -You have at least 1, possibly 3 rib fractures.  As discussed this can take several weeks to heal.  Take it easy especially when changing positions.  Ice the area. - To continue naproxen and Tylenol during the day.  Try the pain medication at night to help you sleep if needed. - If still having symptoms after a couple more weeks or they worsen please follow-up with your PCP.     ED Prescriptions     Medication Sig Dispense Auth. Provider   HYDROcodone-acetaminophen (NORCO) 5-325 MG tablet Take 1 tablet by mouth every 6 (six) hours as needed for up to 3 days for severe pain. 12 tablet Shirlee Latch, PA-C      I have reviewed the PDMP during this encounter.   Shirlee Latch, PA-C 12/18/22 [phone]
